# Patient Record
Sex: Male | Born: 1960 | Hispanic: No | Marital: Married | Smoking: Former smoker
Health system: Southern US, Community
[De-identification: ages and names within clinical notes are randomized; demographics above are authoritative.]

## PROBLEM LIST (undated history)

## (undated) HISTORY — PX: OTHER SURGICAL HISTORY: SHX169

---

## 1999-04-05 ENCOUNTER — Emergency Department (HOSPITAL_COMMUNITY): Admission: EM | Admit: 1999-04-05 | Discharge: 1999-04-05 | Payer: Self-pay | Admitting: *Deleted

## 1999-04-05 ENCOUNTER — Encounter: Payer: Self-pay | Admitting: *Deleted

## 2005-07-01 ENCOUNTER — Ambulatory Visit (HOSPITAL_COMMUNITY): Admission: RE | Admit: 2005-07-01 | Discharge: 2005-07-01 | Payer: Self-pay | Admitting: Family Medicine

## 2005-07-08 ENCOUNTER — Encounter: Admission: RE | Admit: 2005-07-08 | Discharge: 2005-07-08 | Payer: Self-pay | Admitting: Otolaryngology

## 2005-07-15 ENCOUNTER — Ambulatory Visit: Payer: Self-pay | Admitting: Hematology & Oncology

## 2005-07-16 ENCOUNTER — Other Ambulatory Visit: Admission: RE | Admit: 2005-07-16 | Discharge: 2005-07-16 | Payer: Self-pay | Admitting: Hematology & Oncology

## 2005-07-16 LAB — CBC WITH DIFFERENTIAL/PLATELET
BASO%: 0.5 % (ref 0.0–2.0)
Basophils Absolute: 0 10e3/uL (ref 0.0–0.1)
EOS%: 8.2 % — ABNORMAL HIGH (ref 0.0–7.0)
Eosinophils Absolute: 0.4 10e3/uL (ref 0.0–0.5)
HCT: 39.4 % (ref 38.7–49.9)
HGB: 13 g/dL (ref 13.0–17.1)
LYMPH%: 30.3 % (ref 14.0–48.0)
MCH: 27.4 pg — ABNORMAL LOW (ref 28.0–33.4)
MCHC: 32.9 g/dL (ref 32.0–35.9)
MCV: 83.2 fL (ref 81.6–98.0)
MONO#: 0.3 10e3/uL (ref 0.1–0.9)
MONO%: 5.7 % (ref 0.0–13.0)
NEUT#: 2.8 10e3/uL (ref 1.5–6.5)
NEUT%: 55.3 % (ref 40.0–75.0)
Platelets: 546 10e3/uL — ABNORMAL HIGH (ref 145–400)
RBC: 4.74 10e6/uL (ref 4.20–5.71)
RDW: 13.7 % (ref 11.2–14.6)
WBC: 5.1 10e3/uL (ref 4.0–10.0)
lymph#: 1.6 10e3/uL (ref 0.9–3.3)

## 2005-07-16 LAB — CHCC SMEAR

## 2005-07-20 LAB — PROTEIN ELECTROPHORESIS, SERUM
Alpha-2-Globulin: 12.3 % — ABNORMAL HIGH (ref 7.1–11.8)
Beta Globulin: 5.6 % (ref 4.7–7.2)
Total Protein, Serum Electrophoresis: 7.3 g/dL (ref 6.0–8.3)

## 2005-07-20 LAB — COMPREHENSIVE METABOLIC PANEL
ALT: 61 U/L — ABNORMAL HIGH (ref 0–40)
Alkaline Phosphatase: 70 U/L (ref 39–117)
Glucose, Bld: 102 mg/dL — ABNORMAL HIGH (ref 70–99)
Sodium: 138 mEq/L (ref 135–145)
Total Bilirubin: 0.3 mg/dL (ref 0.3–1.2)
Total Protein: 7.3 g/dL (ref 6.0–8.3)

## 2005-07-20 LAB — URIC ACID: Uric Acid, Serum: 5.8 mg/dL (ref 2.4–7.0)

## 2005-07-23 ENCOUNTER — Ambulatory Visit (HOSPITAL_COMMUNITY): Admission: RE | Admit: 2005-07-23 | Discharge: 2005-07-23 | Payer: Self-pay | Admitting: Hematology & Oncology

## 2005-08-12 LAB — CBC WITH DIFFERENTIAL/PLATELET
Basophils Absolute: 0 10*3/uL (ref 0.0–0.1)
EOS%: 0.2 % (ref 0.0–7.0)
Eosinophils Absolute: 0 10*3/uL (ref 0.0–0.5)
HCT: 34.8 % — ABNORMAL LOW (ref 38.7–49.9)
HGB: 11.7 g/dL — ABNORMAL LOW (ref 13.0–17.1)
LYMPH%: 16 % (ref 14.0–48.0)
MCH: 27.4 pg — ABNORMAL LOW (ref 28.0–33.4)
MCV: 81.3 fL — ABNORMAL LOW (ref 81.6–98.0)
MONO%: 12.5 % (ref 0.0–13.0)
NEUT#: 4.8 10*3/uL (ref 1.5–6.5)
NEUT%: 71 % (ref 40.0–75.0)
Platelets: 280 10*3/uL (ref 145–400)

## 2005-08-12 LAB — COMPREHENSIVE METABOLIC PANEL
AST: 76 U/L — ABNORMAL HIGH (ref 0–37)
Albumin: 3.9 g/dL (ref 3.5–5.2)
Alkaline Phosphatase: 83 U/L (ref 39–117)
BUN: 8 mg/dL (ref 6–23)
Calcium: 8.6 mg/dL (ref 8.4–10.5)
Creatinine, Ser: 0.83 mg/dL (ref 0.40–1.50)
Glucose, Bld: 106 mg/dL — ABNORMAL HIGH (ref 70–99)

## 2005-08-20 ENCOUNTER — Ambulatory Visit (HOSPITAL_COMMUNITY): Admission: RE | Admit: 2005-08-20 | Discharge: 2005-08-20 | Payer: Self-pay | Admitting: Hematology & Oncology

## 2005-08-27 ENCOUNTER — Ambulatory Visit: Payer: Self-pay | Admitting: Hematology & Oncology

## 2005-08-27 LAB — CBC WITH DIFFERENTIAL/PLATELET
BASO%: 0.3 % (ref 0.0–2.0)
Basophils Absolute: 0 10*3/uL (ref 0.0–0.1)
EOS%: 0 % (ref 0.0–7.0)
Eosinophils Absolute: 0 10*3/uL (ref 0.0–0.5)
HCT: 31.3 % — ABNORMAL LOW (ref 38.7–49.9)
MCHC: 33.3 g/dL (ref 32.0–35.9)
WBC: 5.9 10*3/uL (ref 4.0–10.0)
lymph#: 0.7 10*3/uL — ABNORMAL LOW (ref 0.9–3.3)

## 2005-08-27 LAB — COMPREHENSIVE METABOLIC PANEL
Albumin: 3.5 g/dL (ref 3.5–5.2)
BUN: 11 mg/dL (ref 6–23)
CO2: 29 mEq/L (ref 19–32)
Creatinine, Ser: 0.81 mg/dL (ref 0.40–1.50)
Glucose, Bld: 109 mg/dL — ABNORMAL HIGH (ref 70–99)
Potassium: 3.9 mEq/L (ref 3.5–5.3)
Total Bilirubin: 0.3 mg/dL (ref 0.3–1.2)

## 2005-08-27 LAB — LACTATE DEHYDROGENASE: LDH: 206 U/L (ref 94–250)

## 2005-09-09 LAB — COMPREHENSIVE METABOLIC PANEL
AST: 67 U/L — ABNORMAL HIGH (ref 0–37)
Albumin: 3.6 g/dL (ref 3.5–5.2)
Alkaline Phosphatase: 116 U/L (ref 39–117)
BUN: 10 mg/dL (ref 6–23)
Creatinine, Ser: 0.83 mg/dL (ref 0.40–1.50)
Potassium: 3.7 mEq/L (ref 3.5–5.3)
Total Bilirubin: 0.4 mg/dL (ref 0.3–1.2)

## 2005-09-09 LAB — CBC WITH DIFFERENTIAL/PLATELET
EOS%: 0.1 % (ref 0.0–7.0)
Eosinophils Absolute: 0 10*3/uL (ref 0.0–0.5)
HCT: 28.2 % — ABNORMAL LOW (ref 38.7–49.9)
LYMPH%: 11.1 % — ABNORMAL LOW (ref 14.0–48.0)
MCHC: 34.1 g/dL (ref 32.0–35.9)
MONO#: 0.8 10*3/uL (ref 0.1–0.9)
MONO%: 10.5 % (ref 0.0–13.0)
NEUT#: 5.9 10*3/uL (ref 1.5–6.5)
RBC: 3.44 10*6/uL — ABNORMAL LOW (ref 4.20–5.71)
WBC: 7.5 10*3/uL (ref 4.0–10.0)
lymph#: 0.8 10*3/uL — ABNORMAL LOW (ref 0.9–3.3)

## 2005-09-20 ENCOUNTER — Emergency Department (HOSPITAL_COMMUNITY): Admission: EM | Admit: 2005-09-20 | Discharge: 2005-09-21 | Payer: Self-pay | Admitting: Emergency Medicine

## 2005-09-22 ENCOUNTER — Ambulatory Visit (HOSPITAL_COMMUNITY): Admission: RE | Admit: 2005-09-22 | Discharge: 2005-09-22 | Payer: Self-pay | Admitting: Hematology & Oncology

## 2005-09-24 LAB — CBC WITH DIFFERENTIAL/PLATELET
Basophils Absolute: 0 10*3/uL (ref 0.0–0.1)
Eosinophils Absolute: 0 10*3/uL (ref 0.0–0.5)
LYMPH%: 11.5 % — ABNORMAL LOW (ref 14.0–48.0)
MONO#: 0.6 10*3/uL (ref 0.1–0.9)
MONO%: 9.2 % (ref 0.0–13.0)
NEUT%: 78.7 % — ABNORMAL HIGH (ref 40.0–75.0)
Platelets: 298 10*3/uL (ref 145–400)
RBC: 3.23 10*6/uL — ABNORMAL LOW (ref 4.20–5.71)
RDW: 21.2 % — ABNORMAL HIGH (ref 11.2–14.6)

## 2005-10-08 LAB — COMPREHENSIVE METABOLIC PANEL
ALT: 203 U/L — ABNORMAL HIGH (ref 0–40)
AST: 169 U/L — ABNORMAL HIGH (ref 0–37)
Albumin: 3.7 g/dL (ref 3.5–5.2)
Alkaline Phosphatase: 187 U/L — ABNORMAL HIGH (ref 39–117)
CO2: 26 mEq/L (ref 19–32)
Creatinine, Ser: 0.7 mg/dL (ref 0.40–1.50)
Potassium: 4.2 mEq/L (ref 3.5–5.3)
Sodium: 140 mEq/L (ref 135–145)
Total Bilirubin: 0.5 mg/dL (ref 0.3–1.2)
Total Protein: 6.3 g/dL (ref 6.0–8.3)

## 2005-10-08 LAB — CBC WITH DIFFERENTIAL/PLATELET
Basophils Absolute: 0.1 10*3/uL (ref 0.0–0.1)
HCT: 28.3 % — ABNORMAL LOW (ref 38.7–49.9)
MCV: 90.5 fL (ref 81.6–98.0)
NEUT#: 4.3 10*3/uL (ref 1.5–6.5)
Platelets: 406 10*3/uL — ABNORMAL HIGH (ref 145–400)
RDW: 21.3 % — ABNORMAL HIGH (ref 11.2–14.6)
WBC: 5.4 10*3/uL (ref 4.0–10.0)
lymph#: 0.5 10*3/uL — ABNORMAL LOW (ref 0.9–3.3)

## 2005-10-08 LAB — LACTATE DEHYDROGENASE: LDH: 219 U/L (ref 94–250)

## 2005-10-09 ENCOUNTER — Ambulatory Visit: Payer: Self-pay | Admitting: Hematology & Oncology

## 2005-12-16 ENCOUNTER — Ambulatory Visit (HOSPITAL_COMMUNITY): Admission: RE | Admit: 2005-12-16 | Discharge: 2005-12-16 | Payer: Self-pay | Admitting: Hematology & Oncology

## 2005-12-31 ENCOUNTER — Ambulatory Visit: Payer: Self-pay | Admitting: Hematology & Oncology

## 2006-01-04 LAB — CBC WITH DIFFERENTIAL/PLATELET
BASO%: 0.3 % (ref 0.0–2.0)
Basophils Absolute: 0 10*3/uL (ref 0.0–0.1)
LYMPH%: 31.2 % (ref 14.0–48.0)
MCV: 85.6 fL (ref 81.6–98.0)
NEUT#: 1.6 10*3/uL (ref 1.5–6.5)
Platelets: 314 10*3/uL (ref 145–400)
RBC: 4.47 10*6/uL (ref 4.20–5.71)
RDW: 14 % (ref 11.2–14.6)
WBC: 3.8 10*3/uL — ABNORMAL LOW (ref 4.0–10.0)
lymph#: 1.2 10*3/uL (ref 0.9–3.3)

## 2006-01-04 LAB — COMPREHENSIVE METABOLIC PANEL
BUN: 11 mg/dL (ref 6–23)
CO2: 30 mEq/L (ref 19–32)
Chloride: 103 mEq/L (ref 96–112)
Glucose, Bld: 99 mg/dL (ref 70–99)
Potassium: 4.3 mEq/L (ref 3.5–5.3)
Sodium: 140 mEq/L (ref 135–145)
Total Protein: 7 g/dL (ref 6.0–8.3)

## 2006-04-06 ENCOUNTER — Ambulatory Visit: Payer: Self-pay | Admitting: Hematology & Oncology

## 2006-04-09 LAB — COMPREHENSIVE METABOLIC PANEL
Albumin: 4.2 g/dL (ref 3.5–5.2)
BUN: 15 mg/dL (ref 6–23)
CO2: 29 mEq/L (ref 19–32)
Calcium: 9.2 mg/dL (ref 8.4–10.5)
Chloride: 102 mEq/L (ref 96–112)
Creatinine, Ser: 0.82 mg/dL (ref 0.40–1.50)
Glucose, Bld: 98 mg/dL (ref 70–99)

## 2006-04-09 LAB — CBC WITH DIFFERENTIAL/PLATELET
Basophils Absolute: 0 10*3/uL (ref 0.0–0.1)
EOS%: 10.2 % — ABNORMAL HIGH (ref 0.0–7.0)
Eosinophils Absolute: 0.5 10*3/uL (ref 0.0–0.5)
HCT: 40.8 % (ref 38.7–49.9)
HGB: 13.9 g/dL (ref 13.0–17.1)
MCH: 29.9 pg (ref 28.0–33.4)
MONO#: 0.4 10*3/uL (ref 0.1–0.9)
NEUT#: 1.8 10*3/uL (ref 1.5–6.5)
NEUT%: 40.2 % (ref 40.0–75.0)
lymph#: 1.9 10*3/uL (ref 0.9–3.3)

## 2006-04-09 LAB — LACTATE DEHYDROGENASE: LDH: 143 U/L (ref 94–250)

## 2006-06-08 ENCOUNTER — Ambulatory Visit: Payer: Self-pay | Admitting: Hematology & Oncology

## 2006-06-11 LAB — COMPREHENSIVE METABOLIC PANEL
AST: 258 U/L — ABNORMAL HIGH (ref 0–37)
Albumin: 4.1 g/dL (ref 3.5–5.2)
Alkaline Phosphatase: 107 U/L (ref 39–117)
BUN: 13 mg/dL (ref 6–23)
Calcium: 9 mg/dL (ref 8.4–10.5)
Chloride: 107 mEq/L (ref 96–112)
Glucose, Bld: 91 mg/dL (ref 70–99)
Potassium: 4.5 mEq/L (ref 3.5–5.3)
Sodium: 142 mEq/L (ref 135–145)
Total Protein: 7.6 g/dL (ref 6.0–8.3)

## 2006-06-11 LAB — CBC WITH DIFFERENTIAL/PLATELET
Basophils Absolute: 0 10*3/uL (ref 0.0–0.1)
EOS%: 19.5 % — ABNORMAL HIGH (ref 0.0–7.0)
Eosinophils Absolute: 0.7 10*3/uL — ABNORMAL HIGH (ref 0.0–0.5)
HGB: 12.9 g/dL — ABNORMAL LOW (ref 13.0–17.1)
NEUT#: 1.5 10*3/uL (ref 1.5–6.5)
RBC: 4.49 10*6/uL (ref 4.20–5.71)
RDW: 12 % (ref 11.2–14.6)
lymph#: 1.1 10*3/uL (ref 0.9–3.3)

## 2006-06-11 LAB — LACTATE DEHYDROGENASE: LDH: 213 U/L (ref 94–250)

## 2006-07-05 ENCOUNTER — Ambulatory Visit (HOSPITAL_COMMUNITY): Admission: RE | Admit: 2006-07-05 | Discharge: 2006-07-05 | Payer: Self-pay | Admitting: Hematology & Oncology

## 2006-09-08 ENCOUNTER — Ambulatory Visit: Payer: Self-pay | Admitting: Hematology & Oncology

## 2006-09-10 LAB — CBC WITH DIFFERENTIAL/PLATELET
Eosinophils Absolute: 0.4 10*3/uL (ref 0.0–0.5)
MONO#: 0.5 10*3/uL (ref 0.1–0.9)
MONO%: 16.1 % — ABNORMAL HIGH (ref 0.0–13.0)
NEUT#: 1.2 10*3/uL — ABNORMAL LOW (ref 1.5–6.5)
RBC: 4.51 10*6/uL (ref 4.20–5.71)
RDW: 11.4 % (ref 11.2–14.6)
WBC: 3.1 10*3/uL — ABNORMAL LOW (ref 4.0–10.0)
lymph#: 0.9 10*3/uL (ref 0.9–3.3)

## 2006-09-10 LAB — COMPREHENSIVE METABOLIC PANEL
Albumin: 4 g/dL (ref 3.5–5.2)
Alkaline Phosphatase: 110 U/L (ref 39–117)
Chloride: 105 mEq/L (ref 96–112)
Glucose, Bld: 97 mg/dL (ref 70–99)
Potassium: 4.7 mEq/L (ref 3.5–5.3)
Sodium: 140 mEq/L (ref 135–145)
Total Protein: 7.8 g/dL (ref 6.0–8.3)

## 2006-12-22 ENCOUNTER — Ambulatory Visit: Payer: Self-pay | Admitting: Hematology & Oncology

## 2006-12-31 LAB — CBC WITH DIFFERENTIAL/PLATELET
BASO%: 1.3 % (ref 0.0–2.0)
LYMPH%: 34.5 % (ref 14.0–48.0)
MCHC: 34.2 g/dL (ref 32.0–35.9)
MCV: 86.4 fL (ref 81.6–98.0)
MONO#: 0.3 10*3/uL (ref 0.1–0.9)
MONO%: 8.1 % (ref 0.0–13.0)
Platelets: 228 10*3/uL (ref 145–400)
RBC: 4.45 10*6/uL (ref 4.20–5.71)
WBC: 3.9 10*3/uL — ABNORMAL LOW (ref 4.0–10.0)

## 2007-04-12 ENCOUNTER — Ambulatory Visit: Payer: Self-pay | Admitting: Hematology & Oncology

## 2007-04-14 LAB — CBC WITH DIFFERENTIAL/PLATELET
Basophils Absolute: 0 10*3/uL (ref 0.0–0.1)
EOS%: 28.4 % — ABNORMAL HIGH (ref 0.0–7.0)
Eosinophils Absolute: 1.1 10*3/uL — ABNORMAL HIGH (ref 0.0–0.5)
LYMPH%: 36.4 % (ref 14.0–48.0)
MCHC: 34.1 g/dL (ref 32.0–35.9)
MONO%: 7.9 % (ref 0.0–13.0)
Platelets: 184 10*3/uL (ref 145–400)
RDW: 13.6 % (ref 11.2–14.6)
lymph#: 1.4 10*3/uL (ref 0.9–3.3)

## 2007-04-14 LAB — LACTATE DEHYDROGENASE: LDH: 200 U/L (ref 94–250)

## 2007-04-14 LAB — COMPREHENSIVE METABOLIC PANEL
ALT: 362 U/L — ABNORMAL HIGH (ref 0–53)
Albumin: 4.1 g/dL (ref 3.5–5.2)
Calcium: 9 mg/dL (ref 8.4–10.5)
Chloride: 104 mEq/L (ref 96–112)
Potassium: 4.1 mEq/L (ref 3.5–5.3)
Total Protein: 7.3 g/dL (ref 6.0–8.3)

## 2007-08-30 ENCOUNTER — Ambulatory Visit: Payer: Self-pay | Admitting: Hematology & Oncology

## 2007-12-09 IMAGING — PT NM PET TUM IMG SKULL BASE T - THIGH
1 of 5 series · 1 of 25 positions shown · non-contrast
Comparison: CT of the neck from 07/08/05.

Addendum BeginsOriginal report by Dr. Zenitram.  Following addendum by Dr. Dosanjh on 07/24/2005:
 In the body of the report, it is stated that there is abnormal activity in the left submandibular region and left neck.  This should actually read the right submandibular region and right neck.  The impression is correct with abnormal activity in the right submandibular region and right neck. 
 This has been discussed with Dr. Zenitram.  

 Addendum Ends
CLINICAL DATA: Lymphoma.  Right neck swelling.
FDG PET-CT TUMOR IMAGING (SKULL BASE TO THIGHS):
Fasting Blood Glucose:  102
TECHNIQUE: 16.0 mCi F-18 FDG were administered via right hand.  Full ring PET imaging was performed from the skull base through the mid-thighs 60 minutes after injection.  CT data was obtained and used for attenuation correction and anatomic localization only.  (This was not acquired as a diagnostic CT examination.)

[Series 2: ct images · axial · 3.8mm · 0.98mm/px · 1 of 267 slices shown]
[im 222/267  soft-tissue]
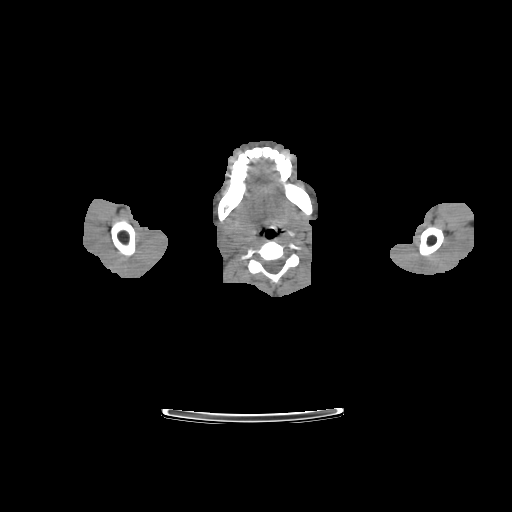

[1 of 25 positions shown; findings below may reference images not displayed]

FINDINGS: There continues to be bilateral chronic maxillary sinusitis.  There is mild right apical pleural parenchymal scarring which does not have associated abnormal increased activity.  In the left submandibular region and tracking in the left neck, we demonstrate considerable abnormal increased FDG activity, with the mass in this vicinity having a maximum standard uptake value of 8.7 grams/liter. In addition, there is increased activity along the right tonsillar pillar, which appears asymmetrically thickened.  This tonsillar pillar activity has a maximum standard uptake value of 6.8 g/ml, and is suspicious for neoplastic involvement of the right tonsil.  Activity along the orbits is believed to likely be due to ocular motion during imaging. 
There is a small focus of faintly increased FDG activity just lateral to the right thyroid lobe, likely representing involvement of a small right station 3 lymph node.  
There is symmetric activity in the brown fat of the shoulders, a well described normal variant.  
No significant abnormal activity is identified in the chest, abdomen, or pelvis.
IMPRESSION: Abnormal activity and right submandibular mass, in the right tonsillar pillar and potentially in a small right station 3 lymph node.

## 2009-04-05 ENCOUNTER — Emergency Department (HOSPITAL_COMMUNITY): Admission: EM | Admit: 2009-04-05 | Discharge: 2009-04-05 | Payer: Self-pay | Admitting: Emergency Medicine

## 2009-05-03 ENCOUNTER — Emergency Department (HOSPITAL_COMMUNITY): Admission: EM | Admit: 2009-05-03 | Discharge: 2009-05-03 | Payer: Self-pay | Admitting: Family Medicine

## 2010-02-01 ENCOUNTER — Emergency Department (HOSPITAL_COMMUNITY)
Admission: EM | Admit: 2010-02-01 | Discharge: 2010-02-01 | Payer: Self-pay | Source: Home / Self Care | Admitting: Emergency Medicine

## 2011-02-02 ENCOUNTER — Emergency Department (HOSPITAL_COMMUNITY)
Admission: EM | Admit: 2011-02-02 | Discharge: 2011-02-03 | Disposition: A | Discharge status: Home / Self Care | Payer: BC Managed Care – PPO | Attending: Emergency Medicine | Admitting: Emergency Medicine

## 2011-02-02 ENCOUNTER — Encounter: Payer: Self-pay | Admitting: Emergency Medicine

## 2011-02-02 ENCOUNTER — Emergency Department (HOSPITAL_COMMUNITY): Payer: BC Managed Care – PPO

## 2011-02-02 ENCOUNTER — Other Ambulatory Visit: Payer: Self-pay

## 2011-02-02 DIAGNOSIS — J4489 Other specified chronic obstructive pulmonary disease: Secondary | ICD-10-CM | POA: Insufficient documentation

## 2011-02-02 DIAGNOSIS — R0609 Other forms of dyspnea: Secondary | ICD-10-CM | POA: Insufficient documentation

## 2011-02-02 DIAGNOSIS — R0989 Other specified symptoms and signs involving the circulatory and respiratory systems: Secondary | ICD-10-CM | POA: Insufficient documentation

## 2011-02-02 DIAGNOSIS — J449 Chronic obstructive pulmonary disease, unspecified: Secondary | ICD-10-CM

## 2011-02-02 DIAGNOSIS — R0602 Shortness of breath: Secondary | ICD-10-CM | POA: Insufficient documentation

## 2011-02-02 DIAGNOSIS — R05 Cough: Secondary | ICD-10-CM | POA: Insufficient documentation

## 2011-02-02 DIAGNOSIS — R079 Chest pain, unspecified: Secondary | ICD-10-CM | POA: Insufficient documentation

## 2011-02-02 DIAGNOSIS — R059 Cough, unspecified: Secondary | ICD-10-CM | POA: Insufficient documentation

## 2011-02-02 DIAGNOSIS — R062 Wheezing: Secondary | ICD-10-CM | POA: Insufficient documentation

## 2011-02-02 DIAGNOSIS — R0600 Dyspnea, unspecified: Secondary | ICD-10-CM

## 2011-02-02 NOTE — ED Notes (Signed)
PT. REPORTS SUBSTERNAL CHEST PAIN ONSET LAST Monday WITH SOB AND PRODUCTIVE COUGH , DENIES NAUSEA OR VOMITTING , NO DIAPHORESIS.

## 2011-02-03 LAB — BASIC METABOLIC PANEL
BUN: 11 mg/dL (ref 6–23)
CO2: 28 mEq/L (ref 19–32)
Calcium: 9.4 mg/dL (ref 8.4–10.5)
Chloride: 100 mEq/L (ref 96–112)
Creatinine, Ser: 0.78 mg/dL (ref 0.50–1.35)
GFR calc Af Amer: >90 mL/min (ref >90)
GFR calc non Af Amer: >90 mL/min (ref >90)
Glucose, Bld: 108 mg/dL — ABNORMAL HIGH (ref 70–99)
Potassium: 4 mEq/L (ref 3.5–5.1)
Sodium: 135 mEq/L (ref 135–145)

## 2011-02-03 LAB — CBC
HCT: 40.7 % (ref 39.0–52.0)
Hemoglobin: 14.3 g/dL (ref 13.0–17.0)
MCH: 29.1 pg (ref 26.0–34.0)
MCHC: 35.1 g/dL (ref 30.0–36.0)
MCV: 82.9 fL (ref 78.0–100.0)
Platelets: 235 10*3/uL (ref 150–400)
RBC: 4.91 MIL/uL (ref 4.22–5.81)
RDW: 13 % (ref 11.5–15.5)
WBC: 7.9 10*3/uL (ref 4.0–10.5)

## 2011-02-03 LAB — POCT I-STAT TROPONIN I: Troponin i, poc: 0 ng/mL (ref 0.00–0.08)

## 2011-02-03 MED ORDER — IPRATROPIUM BROMIDE 0.02 % IN SOLN
0.5000 mg | Freq: Once | RESPIRATORY_TRACT | Status: AC
  Administered 2011-02-03: 0.5 mg via RESPIRATORY_TRACT
  Filled 2011-02-03: qty 2.5

## 2011-02-03 MED ORDER — SODIUM CHLORIDE 0.9 % IV SOLN
INTRAVENOUS | Status: DC
  Administered 2011-02-03: 01:00:00 via INTRAVENOUS

## 2011-02-03 MED ORDER — GUAIFENESIN-CODEINE 100-10 MG/5ML PO SYRP: 5.0000 mL | ORAL_SOLUTION | Freq: Three times a day (TID) | ORAL | Status: AC | PRN

## 2011-02-03 MED ORDER — PREDNISONE 20 MG PO TABS: 40.0000 mg | ORAL_TABLET | Freq: Every day | ORAL | Status: DC

## 2011-02-03 MED ORDER — ALBUTEROL SULFATE (5 MG/ML) 0.5% IN NEBU
5.0000 mg | INHALATION_SOLUTION | Freq: Once | RESPIRATORY_TRACT | Status: AC
  Administered 2011-02-03: 5 mg via RESPIRATORY_TRACT
  Filled 2011-02-03: qty 0.5

## 2011-02-03 MED ORDER — METHYLPREDNISOLONE SODIUM SUCC 125 MG IJ SOLR
125.0000 mg | Freq: Once | INTRAMUSCULAR | Status: AC
  Administered 2011-02-03: 125 mg via INTRAVENOUS
  Filled 2011-02-03: qty 2

## 2011-02-03 NOTE — ED Notes (Signed)
Received pt. From triage, pt. Alert and oriented, ambulatory gait steady, NAD noted
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
## 2011-02-03 NOTE — ED Notes (Signed)
Pt. Alert and oriented, discharged to home with family, at. Ambulatory, gait steady, NAD noted

## 2014-03-29 ENCOUNTER — Ambulatory Visit (HOSPITAL_COMMUNITY)
Admission: RE | Admit: 2014-03-29 | Discharge: 2014-03-29 | Disposition: A | Discharge status: Home / Self Care | Payer: BLUE CROSS/BLUE SHIELD | Source: Ambulatory Visit | Attending: Internal Medicine | Admitting: Internal Medicine

## 2014-03-29 ENCOUNTER — Encounter: Payer: Self-pay | Admitting: Internal Medicine

## 2014-03-29 ENCOUNTER — Ambulatory Visit (INDEPENDENT_AMBULATORY_CARE_PROVIDER_SITE_OTHER): Payer: BLUE CROSS/BLUE SHIELD | Admitting: Internal Medicine

## 2014-03-29 VITALS — BP 115/79 | HR 78 | Temp 97.2°F | Ht 63.0 in | Wt 119.5 lb

## 2014-03-29 DIAGNOSIS — Z8579 Personal history of other malignant neoplasms of lymphoid, hematopoietic and related tissues: Secondary | ICD-10-CM | POA: Diagnosis not present

## 2014-03-29 DIAGNOSIS — Z0000 Encounter for general adult medical examination without abnormal findings: Secondary | ICD-10-CM

## 2014-03-29 DIAGNOSIS — R0602 Shortness of breath: Secondary | ICD-10-CM | POA: Diagnosis present

## 2014-03-29 DIAGNOSIS — R918 Other nonspecific abnormal finding of lung field: Secondary | ICD-10-CM | POA: Insufficient documentation

## 2014-03-29 DIAGNOSIS — J90 Pleural effusion, not elsewhere classified: Secondary | ICD-10-CM | POA: Diagnosis not present

## 2014-03-29 DIAGNOSIS — Z23 Encounter for immunization: Secondary | ICD-10-CM | POA: Diagnosis not present

## 2014-03-29 DIAGNOSIS — J4520 Mild intermittent asthma, uncomplicated: Secondary | ICD-10-CM | POA: Diagnosis not present

## 2014-03-29 DIAGNOSIS — Z8572 Personal history of non-Hodgkin lymphomas: Secondary | ICD-10-CM

## 2014-03-29 LAB — COMPLETE METABOLIC PANEL WITH GFR
ALT: 46 U/L (ref 0–53)
AST: 45 U/L — ABNORMAL HIGH (ref 0–37)
Albumin: 3.8 g/dL (ref 3.5–5.2)
Alkaline Phosphatase: 65 U/L (ref 39–117)
BUN: 9 mg/dL (ref 6–23)
CO2: 29 mEq/L (ref 19–32)
Calcium: 9.4 mg/dL (ref 8.4–10.5)
Chloride: 101 mEq/L (ref 96–112)
Creat: 0.83 mg/dL (ref 0.50–1.35)
GFR, Est African American: >89 mL/min
GFR, Est Non African American: >89 mL/min
Glucose, Bld: 96 mg/dL (ref 70–99)
Potassium: 4.8 mEq/L (ref 3.5–5.3)
Sodium: 139 mEq/L (ref 135–145)
Total Bilirubin: 0.6 mg/dL (ref 0.2–1.2)
Total Protein: 8.3 g/dL (ref 6.0–8.3)

## 2014-03-29 LAB — LIPID PANEL
CHOL/HDL RATIO: 3.6 ratio
Cholesterol: 137 mg/dL (ref 0–200)
HDL: 38 mg/dL — AB (ref >39)
LDL CALC: 87 mg/dL (ref 0–99)
TRIGLYCERIDES: 60 mg/dL (ref <150)
VLDL: 12 mg/dL (ref 0–40)

## 2014-03-29 LAB — POCT GLYCOSYLATED HEMOGLOBIN (HGB A1C): HEMOGLOBIN A1C: 5.2

## 2014-03-29 LAB — GLUCOSE, CAPILLARY: Glucose-Capillary: 97 mg/dL (ref 70–99)

## 2014-03-29 MED ORDER — ESOMEPRAZOLE MAGNESIUM 20 MG PO CPDR: 20.0000 mg | DELAYED_RELEASE_CAPSULE | Freq: Every day | ORAL | Status: AC

## 2014-03-29 MED ORDER — BECLOMETHASONE DIPROPIONATE 80 MCG/ACT IN AERS: 2.0000 | INHALATION_SPRAY | Freq: Two times a day (BID) | RESPIRATORY_TRACT | Status: DC

## 2014-03-29 MED ORDER — ALBUTEROL SULFATE HFA 108 (90 BASE) MCG/ACT IN AERS: 1.0000 | INHALATION_SPRAY | RESPIRATORY_TRACT | Status: AC | PRN

## 2014-03-29 NOTE — Patient Instructions (Addendum)
Thank you for bringing your medicines today. This helps us keep you safe from mistakes.  To help with her reading, please take the following medications: -Take Qvar inhaler 2 puffs twice a day -Take albuterol 1-2 puffs as needed every 4 hours  -Take Nexium 20 mg 30 minutes before you eat every day  We will see you back in 4 weeks.

## 2014-03-29 NOTE — Addendum Note (Signed)
Addended by: Beather ArbourPATEL, Furkan Keenum V on: 03/29/2014 01:29 PM   Modules accepted: Orders

## 2014-03-29 NOTE — Progress Notes (Signed)
[facility] Medicine Clinic Attending  Case discussed with Dr. Patel at the time of the visit.  We reviewed the resident's history and exam and pertinent patient test results.  I agree with the assessment, diagnosis, and plan of care documented in the resident's note.  

## 2014-03-29 NOTE — Assessment & Plan Note (Addendum)
-  Order CMET, A1c, lipid panel as he is interested in establishing PCP care with us -Give Tdap vaccine as he is interested in getting it since it has been more than 10 years since he last received it  ADDENDUM 04/03/2014  9:15 AM:  -A1c, lipid panel grossly reassuring. HDL appears just below the lower limit of normal. -CMET notable only for mildly elevated AST, just above the upper limit of normal

## 2014-03-29 NOTE — Assessment & Plan Note (Addendum)
Overview -On January 25, he reports he had terrible cough at night.  -He had gone to an urgent care and was given albuterol inhaler as well as a Qvar inhaler both of which seemed to improve his symptoms but he is now out.  -He has also tried taking Mucinex 1-2 tablets every 4 hours though it gave him some kind of stomach reaction which she did not find pleasant.  -He also brought with him today photographs on his phone that show blood-tinged sputum which reports was more prominent when his symptoms were worse but have since resolved.  -His cough is now productive only with clear sputum.  -He reports having smoked for 25 years roughly 1 pack per day though has been off cigarettes for the last 10 years. He also denies alcohol or drug use.  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  -He denies any recent sick contacts, fever, night sweats, heartburn though reports wheezing when his cough gets at its worst.  Assessment -Does appear consistent with asthma specially given his prior smoking history. Degree of severity is unknown but will call it mild intermittent for now.  -He does have a past history of lymphoma, and blood-tinged sputum could be a concerning sign however he is otherwise hemodynamically stable and physical exam findings were reassuring. Denies any constitutional symptoms  -GERD is also a possible contributor given that he had been on Nexium in the past though denies any symptoms of heartburn  Plan -Explained to patient how to take Qvar and albuterol -Refilled both inhalers along with a prescription for Nexium -Recommended Robitussin syrup for cough if he would like to try it -Order chest x-ray -Refer for outpatient PFTs -Check CBC with differential  ADDENDUM 03/29/2014  1:28 PM:  -Forgot to add CBC to original lab order and patient had already left the office so it will need to be collected at next visit  ADDENDUM 04/03/2014  5:06 PM:  -I spoke with the patient this afternoon. He  reports his cough has resolved and he feels great. Therefore, I do not feel strongly to treat for pneumonia given his chest x-ray findings.

## 2014-03-29 NOTE — Progress Notes (Signed)
   Subjective:    Patient ID: Curtis Waters, male    DOB: 1960/07/04, 54 y.o.   MRN: [id number]  HPI Curtis Waters is a 54 year old male with no known past medical history who is presenting today for a first-time visit. Please see assessment & plan for documentation of each problem.  Review of Systems  Constitutional: Positive for unexpected weight change (Feels it is related to his cough which prevents him from eating properly). Negative for fever.  Respiratory: Positive for cough (Productive with some blood-tinged sputum at its worst) and wheezing (Not at the moment though when he gets really bad). Negative for shortness of breath.   Cardiovascular: Negative for chest pain.  Gastrointestinal: Negative for nausea, vomiting, abdominal pain and diarrhea.  Neurological: Negative for dizziness.       Objective:   Physical Exam Constitutional: He is oriented to person, place, and time. Pleasant Curtis Waters male. Several tattoos noted over his abdomen extremities along with his forehead.  Head: Normocephalic and atraumatic.  Eyes: Conjunctivae are normal. Pupils are equal, round, and reactive to light.  Neck: Scar on right side consistent with prior intervention for his lymphoma, possibly biopsy. Cardiovascular: Normal rate, regular rhythm and normal heart sounds.  Exam reveals no gallop and no friction rub.   No murmur heard. Pulmonary/Chest: Effort normal. No respiratory distress. He has no wheezes. He has no rales.  Abdominal: Soft. Bowel sounds are normal. He exhibits no distension. There is no tenderness.  Neurological: He is alert and oriented to person, place, and time. No cranial nerve deficit. 5 out of 5 strength in both her and lower extremities bilaterally. Coordination normal.  Skin: Skin is warm and dry. He is not diaphoretic.  Psychiatric: His behavior is normal.          Assessment & Plan:

## 2014-03-29 NOTE — Assessment & Plan Note (Addendum)
-  Chart review is notable only for multiple ED visits including 09/20/2005 which notes that he was on multiple chemotherapeutics consistent with his self-report of lymphoma: Cytoxan, Adriamycin, vincristine, rituximab.  -He also has several office encounters with oncology though there is no information listed in any of them, likely because the notes are in an older system.

## 2014-04-04 ENCOUNTER — Encounter: Payer: Self-pay | Admitting: Internal Medicine

## 2014-04-04 DIAGNOSIS — Z8572 Personal history of non-Hodgkin lymphomas: Secondary | ICD-10-CM

## 2014-04-04 NOTE — Progress Notes (Signed)
Patient ID: Curtis Waters, male   DOB: 07-06-1960, 54 y.o.   MRN: [id number]   I am completing paperwork today from [facility] that requires my signature:  Received 03/28/14: Authorization for the substitution of a generic for the Nexium 20 mg capsules that were last prescribed at his visit

## 2014-05-30 NOTE — Addendum Note (Signed)
Addended by: Beather ArbourPATEL, RUSHIL V on: 05/30/2014 04:10 PM   Modules accepted: Orders

## 2015-04-10 ENCOUNTER — Other Ambulatory Visit: Payer: Self-pay

## 2015-04-10 DIAGNOSIS — J4520 Mild intermittent asthma, uncomplicated: Secondary | ICD-10-CM

## 2015-04-10 MED ORDER — BECLOMETHASONE DIPROPIONATE 80 MCG/ACT IN AERS: 2.0000 | INHALATION_SPRAY | Freq: Two times a day (BID) | RESPIRATORY_TRACT | Status: AC

## 2015-04-12 NOTE — Telephone Encounter (Signed)
Pt needs appointment

## 2015-04-16 ENCOUNTER — Encounter: Payer: Self-pay | Admitting: Internal Medicine

## 2015-04-30 ENCOUNTER — Telehealth: Payer: Self-pay | Admitting: Internal Medicine

## 2015-05-01 ENCOUNTER — Ambulatory Visit: Payer: BLUE CROSS/BLUE SHIELD | Admitting: Internal Medicine

## 2015-05-09 ENCOUNTER — Telehealth: Payer: Self-pay | Admitting: Internal Medicine

## 2015-05-09 NOTE — Telephone Encounter (Signed)
APPT. REMINDER CALL, NO ANSWER, NO VOICE MAIL °

## 2015-05-10 ENCOUNTER — Ambulatory Visit: Payer: BLUE CROSS/BLUE SHIELD | Admitting: Internal Medicine

## 2017-08-11 ENCOUNTER — Encounter: Payer: Self-pay | Admitting: *Deleted

## 2020-05-08 ENCOUNTER — Other Ambulatory Visit: Payer: Self-pay

## 2020-05-08 ENCOUNTER — Ambulatory Visit: Payer: Self-pay

## 2020-05-08 ENCOUNTER — Other Ambulatory Visit: Payer: Self-pay | Admitting: Occupational Medicine

## 2020-05-08 DIAGNOSIS — S20212A Contusion of left front wall of thorax, initial encounter: Secondary | ICD-10-CM
